# Patient Record
Sex: Male | Born: 1990 | Race: Black or African American | Hispanic: No | Smoking: Former smoker
Health system: Southern US, Community
[De-identification: ages and names within clinical notes are randomized; demographics above are authoritative.]

## PROBLEM LIST (undated history)

## (undated) DIAGNOSIS — M6282 Rhabdomyolysis: Secondary | ICD-10-CM

---

## 2014-04-10 ENCOUNTER — Emergency Department: Payer: Self-pay | Admitting: Internal Medicine

## 2016-09-04 ENCOUNTER — Encounter: Payer: Self-pay | Admitting: Emergency Medicine

## 2016-09-04 ENCOUNTER — Emergency Department
Admission: EM | Admit: 2016-09-04 | Discharge: 2016-09-04 | Disposition: A | Discharge status: Home / Self Care | Payer: Self-pay | Attending: Emergency Medicine | Admitting: Emergency Medicine

## 2016-09-04 ENCOUNTER — Emergency Department: Payer: Self-pay

## 2016-09-04 DIAGNOSIS — S93402A Sprain of unspecified ligament of left ankle, initial encounter: Secondary | ICD-10-CM | POA: Insufficient documentation

## 2016-09-04 DIAGNOSIS — Y929 Unspecified place or not applicable: Secondary | ICD-10-CM | POA: Insufficient documentation

## 2016-09-04 DIAGNOSIS — X501XXA Overexertion from prolonged static or awkward postures, initial encounter: Secondary | ICD-10-CM | POA: Insufficient documentation

## 2016-09-04 DIAGNOSIS — Y9389 Activity, other specified: Secondary | ICD-10-CM | POA: Insufficient documentation

## 2016-09-04 DIAGNOSIS — Y999 Unspecified external cause status: Secondary | ICD-10-CM | POA: Insufficient documentation

## 2016-09-04 MED ORDER — ETODOLAC 200 MG PO CAPS: 200.0000 mg | ORAL_CAPSULE | Freq: Three times a day (TID) | ORAL | 0 refills | Status: DC

## 2016-09-04 NOTE — ED Triage Notes (Signed)
Pt ambulatory to triage with boot in place to left foot; Pt reports in June sprained left ankle, healed over few months and then reinjured over weekend

## 2016-09-04 NOTE — ED Provider Notes (Signed)
[facility] Emergency Department Provider Note   ____________________________________________   First MD Initiated Contact with Patient 09/04/16 0530     (approximate)  I have reviewed the triage vital signs and the nursing notes.   HISTORY  Chief Complaint Ankle Pain    HPI Erik Kent is a 25 y.o. male who comes into the hospital today reporting that his foot is destroyed. The patient reports that he is unsure if he has a severe sprain or fracture. The patient hurt his left ankle initially in June. The patient is a vascular player. He reports he didn't get it checked out post is taken care of at home. The patient reinjured his ankle over the weekend. He was working out and reports that he slightly twisted it. He has not been taking any medications and has not iced it recently but reports it is been hurting. The patient rates his pain a 10 out of 10 in intensity currently. He has been unable to walk on it without a boot. The patient has a boot at home which he has been using. The patient decided to come [facility] [employer] [facility].    History reviewed. No pertinent past medical history.  There are no active problems to display for this patient.   History reviewed. No pertinent surgical history.  Prior to Admission medications   Not on File    Allergies Patient has no known allergies.  No family history on file.  Social History Social History  Substance Use Topics  . Smoking status: Never Smoker  . Smokeless tobacco: Never Used  . Alcohol use No    Review of Systems Constitutional: No fever/chills Eyes: No visual changes. ENT: No sore throat. Cardiovascular: Denies chest pain. Respiratory: Denies shortness of breath. Gastrointestinal: No abdominal pain.  No nausea, no vomiting.  No diarrhea.  No constipation. Genitourinary: Negative for dysuria. Musculoskeletal:Left ankle pain Skin: Negative for  rash. Neurological: Negative for headaches, focal weakness or numbness.  10-point ROS otherwise negative.  ____________________________________________   PHYSICAL EXAM:  VITAL SIGNS: ED Triage Vitals  Enc Vitals Group     BP 09/04/16 0505 133/73     Pulse Rate 09/04/16 0505 87     Resp 09/04/16 0505 18     Temp 09/04/16 0505 98 F (36.7 C)     Temp Source 09/04/16 0505 Oral     SpO2 09/04/16 0505 99 %     Weight 09/04/16 0503 165 lb (74.8 kg)     Height 09/04/16 0503 5\' 10"  (1.778 m)     Head Circumference --      Peak Flow --      Pain Score 09/04/16 0503 10     Pain Loc --      Pain Edu? --      Excl. in GC? --     Constitutional: Alert and oriented. The patient is sitting on the bed playing on his phone, he is well appearing and in mild distress. Eyes: Conjunctivae are normal. PERRL. EOMI. Head: Atraumatic. Nose: No congestion/rhinnorhea. Mouth/Throat: Mucous membranes are moist.  Oropharynx non-erythematous. Cardiovascular: Normal rate, regular rhythm. Grossly normal heart sounds.  Good peripheral circulation. Respiratory: Normal respiratory effort.  No retractions. Lungs CTAB. Gastrointestinal: Soft and nontender. No distention. Positive bowel sounds Musculoskeletal: No lower extremity tenderness nor edema.   Neurologic:  Normal speech and language.  Skin:  Skin is warm, dry and intact. Marland Kitchen. Psychiatric: Mood and affect are  normal.   ____________________________________________   LABS (all labs ordered are listed, but only abnormal results are displayed)  Labs Reviewed - No data to display ____________________________________________  EKG  none ____________________________________________  RADIOLOGY  Left Ankle Xray ____________________________________________   PROCEDURES  Procedure(s) performed: None  Procedures  Critical Care performed: No  ____________________________________________   INITIAL IMPRESSION / ASSESSMENT AND PLAN / ED  COURSE  Pertinent labs & imaging results that were available during my care of the patient were reviewed by me and considered in my medical decision making (see chart for details).  This is a [id number] rolled male who comes into the hospital today with some left-sided ankle pain. The patient reports that he really injured it recently. I will give the patient a shot of Toradol and we'll put him in an ankle stirrup. I will have the patient follow-up with orthopedic surgery for further evaluation of this ankle pain. The patient did receive an x-ray that was unremarkable. The patient has some swelling so I will also encourage him to rest ice and elevate his left ankle. He'll be discharged home.   Clinical Course as of Sep 04 602  Tue Sep 04, 2016  0541 Mild circumferential soft tissue swelling. No acute fracture or dislocation of the left ankle.   DG Ankle Complete Left [AW]    Clinical Course User Index [AW] Rebecka ApleyAllison P Arlesia Kiel, MD     ____________________________________________   FINAL CLINICAL IMPRESSION(S) / ED DIAGNOSES  Final diagnoses:  Sprain of left ankle, unspecified ligament, initial encounter      NEW MEDICATIONS STARTED DURING THIS VISIT:  New Prescriptions   No medications on file     Note:  This document was prepared using Dragon voice recognition software and may include unintentional dictation errors.    Rebecka ApleyAllison P Lenetta Piche, MD 09/04/16 [phone]

## 2016-09-13 ENCOUNTER — Encounter: Payer: Self-pay | Admitting: Podiatry

## 2016-09-18 NOTE — Progress Notes (Signed)
Patient cancelled appointment.

## 2017-10-20 ENCOUNTER — Other Ambulatory Visit: Payer: Self-pay

## 2017-10-20 ENCOUNTER — Emergency Department: Payer: Self-pay

## 2017-10-20 ENCOUNTER — Encounter: Payer: Self-pay | Admitting: Emergency Medicine

## 2017-10-20 ENCOUNTER — Emergency Department
Admission: EM | Admit: 2017-10-20 | Discharge: 2017-10-20 | Disposition: A | Discharge status: Home / Self Care | Payer: Self-pay | Attending: Student in an Organized Health Care Education/Training Program | Admitting: Student in an Organized Health Care Education/Training Program

## 2017-10-20 DIAGNOSIS — F17200 Nicotine dependence, unspecified, uncomplicated: Secondary | ICD-10-CM | POA: Insufficient documentation

## 2017-10-20 DIAGNOSIS — R079 Chest pain, unspecified: Secondary | ICD-10-CM | POA: Insufficient documentation

## 2017-10-20 DIAGNOSIS — Z79899 Other long term (current) drug therapy: Secondary | ICD-10-CM | POA: Insufficient documentation

## 2017-10-20 LAB — CBC
HEMATOCRIT: 45.6 % (ref 40.0–52.0)
Hemoglobin: 14.3 g/dL (ref 13.0–18.0)
MCH: 24 pg — AB (ref 26.0–34.0)
MCHC: 31.4 g/dL — AB (ref 32.0–36.0)
MCV: 76.4 fL — AB (ref 80.0–100.0)
PLATELETS: 231 10*3/uL (ref 150–440)
RBC: 5.97 MIL/uL — ABNORMAL HIGH (ref 4.40–5.90)
RDW: 13.7 % (ref 11.5–14.5)
WBC: 8.2 10*3/uL (ref 3.8–10.6)

## 2017-10-20 LAB — TROPONIN I: Troponin I: <0.03 ng/mL (ref <0.03)

## 2017-10-20 LAB — BASIC METABOLIC PANEL
Anion gap: 10 (ref 5–15)
BUN: 16 mg/dL (ref 6–20)
CHLORIDE: 99 mmol/L — AB (ref 101–111)
CO2: 26 mmol/L (ref 22–32)
CREATININE: 1.19 mg/dL (ref 0.61–1.24)
Calcium: 9.6 mg/dL (ref 8.9–10.3)
GFR calc Af Amer: >60 mL/min (ref >60)
GFR calc non Af Amer: >60 mL/min (ref >60)
GLUCOSE: 119 mg/dL — AB (ref 65–99)
POTASSIUM: 3.4 mmol/L — AB (ref 3.5–5.1)
SODIUM: 135 mmol/L (ref 135–145)

## 2017-10-20 LAB — CK: Total CK: 535 U/L — ABNORMAL HIGH (ref 49–397)

## 2017-10-20 MED ORDER — SODIUM CHLORIDE 0.9 % IV BOLUS (SEPSIS)
1000.0000 mL | Freq: Once | INTRAVENOUS | Status: AC
  Administered 2017-10-20: 1000 mL via INTRAVENOUS

## 2017-10-20 MED ORDER — LORAZEPAM 0.5 MG PO TABS
0.5000 mg | ORAL_TABLET | Freq: Once | ORAL | Status: AC
  Administered 2017-10-20: 0.5 mg via ORAL
  Filled 2017-10-20: qty 1

## 2017-10-20 NOTE — ED Provider Notes (Signed)
[facility] Emergency Department Provider Note    First MD Initiated Contact with Patient 10/20/17 2112     (approximate)  I have reviewed the triage vital signs and the nursing notes.   HISTORY  Chief Complaint Chest Pain    HPI Erik Kent is a 26 y.o. male is a previously healthy 60-year-old male with no previous hospitalizations presents to the ER with "clavicle pain" on the left side of his chest.  States that it is hard to describe the discomfort but does feel some pressure.  Has not noticed any change with movement.  Denies any trauma.  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  Denies any fevers.  States that he has not had any associated nausea or vomiting.  Denies any neck pain.  States the pain is mild.  Has noted intermittent flutters.  States he was having worsening discomfort today so he went for a run and then went to lift weights.  He states he tried to taken out to make symptoms better but they did not improved.  Denies any personal history of heart disease.  No family history of heart disease.  Family history of sudden cardiac death.  History reviewed. No pertinent past medical history. No family history on file. History reviewed. No pertinent surgical history. There are no active problems to display for this patient.     Prior to Admission medications   Medication Sig Start Date End Date Taking? Authorizing Provider  etodolac (LODINE) 200 MG capsule Take 1 capsule (200 mg total) by mouth every 8 (eight) hours. 09/04/16   Erik Apley, MD    Allergies Patient has no known allergies.    Social History Social History   Tobacco Use  . Smoking status: Current Some Day Smoker  . Smokeless tobacco: Never Used  Substance Use Topics  . Alcohol use: No    Comment: occasionally  . Drug use:  Yes    Types: Marijuana    Review of Systems Patient denies headaches, rhinorrhea, blurry vision, numbness, shortness of breath, chest pain, edema, cough, abdominal pain, nausea, vomiting, diarrhea, dysuria, fevers, rashes or hallucinations unless otherwise stated above in HPI. ____________________________________________   PHYSICAL EXAM:  VITAL SIGNS: Vitals:   10/20/17 2045  BP: (!) 149/64  Pulse: 75  Resp: 18  Temp: 98.4 F (36.9 C)  SpO2: 99%    Constitutional: Alert and oriented. Well appearing and in no acute distress. Eyes: Conjunctivae are normal.  Head: Atraumatic. Nose: No congestion/rhinnorhea. Mouth/Throat: Mucous membranes are moist.   Neck: Painless ROM.  Cardiovascular:   Good peripheral circulation.  No murmurs gallops or rubs noted Respiratory: Normal respiratory effort.  No retractions.  Gastrointestinal: Soft and nontender.  Musculoskeletal: No lower extremity tenderness .  No joint effusions. Neurologic:  Normal speech and language. No gross focal neurologic deficits are appreciated.  Skin:  Skin is warm, dry and intact. No rash noted. Psychiatric: Mood and affect are normal. Speech and behavior are normal.  ____________________________________________   LABS (all labs ordered are listed, but only abnormal results are displayed)  Results for orders placed or performed during the Kent encounter of 10/20/17 (from the past 24 hour(s))  Basic metabolic panel     Status: Abnormal   Collection Time: 10/20/17  8:40 PM  Result Value Ref Range   Sodium 135 135 -  145 mmol/L   Potassium 3.4 (L) 3.5 - 5.1 mmol/L   Chloride 99 (L) 101 - 111 mmol/L   CO2 26 22 - 32 mmol/L   Glucose, Bld 119 (H) 65 - 99 mg/dL   BUN 16 6 - 20 mg/dL   Creatinine, Ser 1.611.19 0.61 - 1.24 mg/dL   Calcium 9.6 8.9 - 09.610.3 mg/dL   GFR calc non Af Amer >60 >60 mL/min   GFR calc Af Amer >60 >60 mL/min   Anion gap 10 5 - 15  CBC     Status: Abnormal   Collection Time: 10/20/17   8:40 PM  Result Value Ref Range   WBC 8.2 3.8 - 10.6 K/uL   RBC 5.97 (H) 4.40 - 5.90 MIL/uL   Hemoglobin 14.3 13.0 - 18.0 g/dL   HCT 04.545.6 40.940.0 - 81.152.0 %   MCV 76.4 (L) 80.0 - 100.0 fL   MCH 24.0 (L) 26.0 - 34.0 pg   MCHC 31.4 (L) 32.0 - 36.0 g/dL   RDW 91.413.7 78.211.5 - 95.614.5 %   Platelets 231 150 - 440 K/uL  Troponin I     Status: None   Collection Time: 10/20/17  8:40 PM  Result Value Ref Range   Troponin I <0.03 <0.03 ng/mL   ____________________________________________  EKG My review and personal interpretation at Time: 20:42   Indication: chest pain  Rate: 90  Rhythm: sinus Axis: normal Other: normal intervals, no wpw, brugada or prolonget QT ____________________________________________  RADIOLOGY  I personally reviewed all radiographic images ordered to evaluate for the above acute complaints and reviewed radiology reports and findings.  These findings were personally discussed with the patient.  Please see medical record for radiology report.  ____________________________________________   PROCEDURES  Procedure(s) performed:  Procedures    Critical Care performed: no ____________________________________________   INITIAL IMPRESSION / ASSESSMENT AND PLAN / ED COURSE  Pertinent labs & imaging results that were available during my care of the patient were reviewed by me and considered in my medical decision making (see chart for details).  DDX: ACS, pericarditis, esophagitis, boerhaaves, pe, dissection, pna, bronchitis, costochondritis   Erik Kent is a 26 y.o. who presents to the ED with symptoms as described above.  Patient well-appearing and in no acute distress.  No evidence of trauma.  No evidence of respiratory distress.  EKG shows no evidence of preexcitation syndrome or ischemia.  Patient with heart score of 0.  He is low risk by Wells criteria and is PERC negative.  His abdominal exam is soft and benign.  Chest x-ray shows no evidence of pneumothorax  fracture or consolidation.  Does not have any murmurs gallops or rubs or exertional syncope to suggest hypertrophic occlusive cardiomyopathy.  Patient's CK was mildly elevated I do suspect some component of dehydration.  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  Patient was given IV fluids for component of dehydration as the patient was having dark colored urine with elevated CK.  He is tolerating oral hydration.  Did discuss signs and symptoms for which he should return to the [facility].  I did give patient referral to cardiology and discussed reasons patient to follow-up with cardiology and PCP.  Have discussed with the patient and available family all diagnostics and treatments performed thus far and all questions were answered to the best of my ability. The patient demonstrates understanding and agreement with plan.  ____________________________________________   FINAL CLINICAL IMPRESSION(S) / ED DIAGNOSES  Final diagnoses:  Chest pain, unspecified type      NEW MEDICATIONS STARTED DURING THIS VISIT:  This SmartLink is deprecated. Use AVSMEDLIST instead to display the medication list for a patient.   Note:  This document was prepared using Dragon voice recognition software and may include unintentional dictation errors.     Willy Eddyobinson, Binyamin Nelis, MD 10/20/17 2241

## 2017-10-20 NOTE — Discharge Instructions (Signed)
Sure to drink plenty of fluids.  Return immediately for any worsening shortness of breath, worsening chest pain or for any other questions or concerns.

## 2017-10-20 NOTE — ED Notes (Signed)
Pt to BR at this time. EKG delayed due to pt in BR.

## 2017-10-20 NOTE — ED Triage Notes (Signed)
Pt c/o chest discomfort, in no acute distress at this time, ambulatory to stat desk.

## 2017-10-20 NOTE — ED Notes (Signed)
Pt to the er for pain to the left side chest near the clavicle. Pt worked out on Thursday or Friday since last workout. Pt denies any injury but is unsure. Pain is not worse with movement. Pt has not noticed any swelling or SOB. Pt has some heart flutters but that has been going on for a while. Pt reports the flutters are what brought him in. Pt denies any dizziness or shaking.

## 2017-10-20 NOTE — ED Notes (Signed)
Patient transported to X-ray 

## 2017-11-10 ENCOUNTER — Other Ambulatory Visit: Payer: Self-pay

## 2017-11-10 ENCOUNTER — Encounter: Payer: Self-pay | Admitting: Emergency Medicine

## 2017-11-10 ENCOUNTER — Emergency Department
Admission: EM | Admit: 2017-11-10 | Discharge: 2017-11-10 | Disposition: A | Discharge status: Home / Self Care | Payer: Self-pay | Attending: Emergency Medicine | Admitting: Emergency Medicine

## 2017-11-10 ENCOUNTER — Emergency Department: Payer: Self-pay

## 2017-11-10 DIAGNOSIS — M6282 Rhabdomyolysis: Secondary | ICD-10-CM

## 2017-11-10 DIAGNOSIS — R079 Chest pain, unspecified: Secondary | ICD-10-CM

## 2017-11-10 DIAGNOSIS — R002 Palpitations: Secondary | ICD-10-CM

## 2017-11-10 DIAGNOSIS — F17200 Nicotine dependence, unspecified, uncomplicated: Secondary | ICD-10-CM | POA: Insufficient documentation

## 2017-11-10 DIAGNOSIS — R001 Bradycardia, unspecified: Secondary | ICD-10-CM

## 2017-11-10 LAB — BASIC METABOLIC PANEL
Anion gap: 8 (ref 5–15)
BUN: 12 mg/dL (ref 6–20)
CO2: 27 mmol/L (ref 22–32)
Calcium: 9.7 mg/dL (ref 8.9–10.3)
Chloride: 102 mmol/L (ref 101–111)
Creatinine, Ser: 1.1 mg/dL (ref 0.61–1.24)
GFR calc non Af Amer: >60 mL/min (ref >60)
Glucose, Bld: 96 mg/dL (ref 65–99)
POTASSIUM: 4.2 mmol/L (ref 3.5–5.1)
SODIUM: 137 mmol/L (ref 135–145)

## 2017-11-10 LAB — TROPONIN I

## 2017-11-10 LAB — CBC
HCT: 44.7 % (ref 40.0–52.0)
HEMOGLOBIN: 14.2 g/dL (ref 13.0–18.0)
MCH: 24.5 pg — ABNORMAL LOW (ref 26.0–34.0)
MCHC: 31.7 g/dL — AB (ref 32.0–36.0)
MCV: 77.5 fL — ABNORMAL LOW (ref 80.0–100.0)
Platelets: 236 10*3/uL (ref 150–440)
RBC: 5.77 MIL/uL (ref 4.40–5.90)
RDW: 13.2 % (ref 11.5–14.5)
WBC: 4.5 10*3/uL (ref 3.8–10.6)

## 2017-11-10 LAB — TSH: TSH: 1.468 u[IU]/mL (ref 0.350–4.500)

## 2017-11-10 LAB — CK: CK TOTAL: 615 U/L — AB (ref 49–397)

## 2017-11-10 MED ORDER — ASPIRIN 81 MG PO CHEW
324.0000 mg | CHEWABLE_TABLET | Freq: Once | ORAL | Status: AC
  Administered 2017-11-10: 324 mg via ORAL
  Filled 2017-11-10: qty 4

## 2017-11-10 NOTE — Discharge Instructions (Addendum)
Please drink plenty of fluid to stay well hydrated and prevent rhadbomyolysis, as rhabdomyolysis can cause permanent damage, including to your kidneys.  Please avoid NSAID medications, including Ibuprofen, Alleve, Motrin or Advil.  Please make a follow-up appointment with your primary care physician to have your CK and your kidney function rechecked, and to discuss the symptoms that have been bothering you.  Return to the emergency department for severe pain, changes in the color of your urine, fever, chest pain, or any other symptoms concerning to you.

## 2017-11-10 NOTE — ED Provider Notes (Signed)
[facility] Emergency Department Provider Note  ____________________________________________  Time seen: Approximately 2:27 PM  I have reviewed the triage vital signs and the nursing notes.   HISTORY  Chief Complaint Chest Pain    HPI Erik Kent is a 27 y.o. male, otherwise healthy, presenting with chest pain.  The patient reports that for several weeks he has had a central chest discomfort over the sternum that is worse, if he contracts his shoulders and pectoral muscles forward.  He denies any association with food, exertion.  He also occasionally reports "feeling his heartbeat and being able to feel his heartbeat in his neck" without any associated lightheadedness or syncope, visual changes, or shortness of breath.  The symptoms make him feel anxious.  He also has nonspecific symptoms including a cut over his right clavicle, now resolved, and feeling like his eyes "get heavy."  the patient was seen here 12/23, with a reassuring workup with the exception of mild rhabdomyolysis.  After discharge, he did not make the recommended follow-up appointments with the primary care physician or cardiologist.  At this time, the patient is symptom-free.  Sh: + occasional tobacco and marijuana, none since 12/23, denies cocaine; professional football player in [place]  FH: no early CAD, no sudden cardiac death    History reviewed. No pertinent past medical history.  There are no active problems to display for this patient.   History reviewed. No pertinent surgical history.  Current Outpatient Rx  . Order #: [id number] Class: Print    Allergies Patient has no known allergies.  No family history on file.  Social History Social History   Tobacco Use  . Smoking status: Current Some Day Smoker  . Smokeless tobacco: Never Used  Substance Use Topics  . Alcohol use: No    Comment: occasionally  . Drug use: Yes    Types: Marijuana    Review of  Systems Constitutional: No fever/chills.  No lightheadedness or syncope.  No myalgias.  No trauma.  Positive significant exercise and strain in the chest from his football workouts. Eyes: No visual changes.  Positive eye heaviness sensation without visual changes including blurred vision or double vision. ENT: No sore throat. No congestion or rhinorrhea. Cardiovascular: Positive central chest pain.  Positive palpitations. Respiratory: Denies shortness of breath.  No cough. Gastrointestinal: No abdominal pain.  No nausea, no vomiting.  No diarrhea.  No constipation. Genitourinary: Negative for dysuria. Musculoskeletal: Negative for back pain. Skin: Negative for rash. Neurological: Negative for headaches. No focal numbness, tingling or weakness.     ____________________________________________   PHYSICAL EXAM:  VITAL SIGNS: ED Triage Vitals  Enc Vitals Group     BP 11/10/17 1311 123/76     Pulse Rate 11/10/17 1311 60     Resp 11/10/17 1311 16     Temp 11/10/17 1311 98.3 F (36.8 C)     Temp Source 11/10/17 1311 Oral     SpO2 11/10/17 1311 99 %     Weight 11/10/17 1317 165 lb (74.8 kg)     Height 11/10/17 1317 5\' 10"  (1.778 m)     Head Circumference --      Peak Flow --      Pain Score 11/10/17 1310 4     Pain Loc --      Pain Edu? --      Excl. in GC? --     Constitutional: Alert and oriented. Well appearing and in no acute distress. Answers questions appropriately.  The patient  is able to move about the room without any discomfort. Eyes: Conjunctivae are normal.  EOMI. No scleral icterus. Head: Atraumatic. Nose: No congestion/rhinnorhea. Mouth/Throat: Mucous membranes are moist.  Neck: No stridor.  Supple.  No JVD.  No meningismus. Cardiovascular: Normal rate, regular rhythm. No murmurs, rubs or gallops.  The patient has a normal skin exam over his chest and I am not able to reproduce his pain with palpation; however, when he flexes his pectoral muscles or crunches  forward, he reports being able to reproduce his pain. Respiratory: Normal respiratory effort.  No accessory muscle use or retractions. Lungs CTAB.  No wheezes, rales or ronchi. Gastrointestinal: Soft, nontender and nondistended.  No guarding or rebound.  No peritoneal signs. Musculoskeletal: No LE edema. No ttp in the calves or palpable cords.  Negative Homan's sign. Neurologic:  A&Ox3.  Speech is clear.  Face and smile are symmetric.  EOMI.  Moves all extremities well. Skin:  Skin is warm, dry and intact. No rash noted. Psychiatric: Mood and affect are normal. Speech and behavior are normal.  Normal judgement  ____________________________________________   LABS (all labs ordered are listed, but only abnormal results are displayed)  Labs Reviewed  CBC - Abnormal; Notable for the following components:      Result Value   MCV 77.5 (*)    MCH 24.5 (*)    MCHC 31.7 (*)    All other components within normal limits  CK - Abnormal; Notable for the following components:   Total CK 615 (*)    All other components within normal limits  BASIC METABOLIC PANEL  TROPONIN I  TSH  CBG MONITORING, ED   ____________________________________________  EKG  ED ECG REPORT I, Rockne Menghini, the attending physician, personally viewed and interpreted this ECG.   Date: 11/10/2017  EKG Time: 1310  Rate: 57  Rhythm: sinus bradycardia  Axis: normal  Intervals:none  ST&T Change: Nonspecific T wave inversion in V1.  Early repolarization without STEMI.  No prolonged QTC, Brugada syndrome or evidence of hypertrophy.  This EKG has a poor baseline tracing and will be repeated.   Repeat EKG : ED ECG REPORT I, Rockne Menghini, the attending physician, personally viewed and interpreted this ECG.   Date: 11/10/2017  EKG Time: 1401  Rate: 52  Rhythm: sinus bradycardia  Axis: normal  Intervals:none  ST&T Change: No STEMI  ED ECG REPORT I, Rockne Menghini, the attending physician,  personally viewed and interpreted this ECG.   Date: 11/10/2017  EKG Time: 1441  Rate: 54  Rhythm: sinus bradycardia  Axis: normal  Intervals:none  ST&T Change: No STEMI   ____________________________________________  RADIOLOGY  Dg Chest 2 View  Result Date: 11/10/2017 CLINICAL DATA:  Chest tightness for 2 weeks. EXAM: CHEST  2 VIEW COMPARISON:  Two-view chest x-ray 10/20/2017 FINDINGS: The heart size and mediastinal contours are within normal limits. Both lungs are clear. The visualized skeletal structures are unremarkable. IMPRESSION: Negative two view chest x-ray Electronically Signed   By: Marin Roberts M.D.   On: 11/10/2017 14:22    ____________________________________________   PROCEDURES  Procedure(s) performed: None  Procedures  Critical Care performed: No ____________________________________________   INITIAL IMPRESSION / ASSESSMENT AND PLAN / ED COURSE  Pertinent labs & imaging results that were available during my care of the patient were reviewed by me and considered in my medical decision making (see chart for details).  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  Overall, the patient is  hemodynamically stable and has sinus bradycardia without any concerning evidence of ischemia, arrhythmia, including prolonged QTC, hypertrophy or Brugada syndrome.  The patient has no history of cocaine use, or concerning family history, nor any red flags such as recurrent syncope.  Today, the patient's clinical examination is reassuring without any cardiopulmonary abnormality or evidence of DVT.  In addition his laboratory studies including negative troponin are reassuring.  It is possible the patient's discomfort is due to musculoskeletal strain.  Today, I am awaiting his CK, I have ordered a TSH, and will evaluate his chest x-ray.  However, I anticipate he will be safe for discharge home with close PMD and  cardiology follow-up.  I did talk to him about getting an echocardiogram.  Plan reevaluation for final disposition.  ----------------------------------------- 3:49 PM on 11/10/2017 -----------------------------------------  The patient continues to be hemodynamically stable and has a reassuring workup in the emergency department.  At this time, he is resting comfortably and has to be awakened to be given his results.  His troponin is negative and chest x-ray does not show any acute cardiopulmonary process.  His electrolytes and blood counts are also reassuring.  He does have a CK of 615 today compared to 535 when he was seen here 12/23.  This is likely due to the significant musculoskeletal exertion that he undergoes for his professional football training.  Today, his creatinine is slightly lower than 12/23 at 1.10 compared to 1.19.  He does not have any evidence of red flag symptoms for rhabdomyolysis, but does have some mild rhabdomyolysis that will need to be treated with aggressive oral hydration and close monitoring of CK and creatinine as an outpatient.  I have provided him with the phone number to establish a primary care physician and recommended that he call the appointment line tomorrow between 8 and 9 AM.  He understands the importance of oral hydration.  I have recommended that he avoid NSAID medications, and significant exertional output until his CK has normalized to prevent permanent damage to his kidneys.  In addition, he will be given the number for the cardiologist on call for outpatient evaluation of his chest pain.  He understands these instructions, as well as follow-up and return precautions.    ____________________________________________  FINAL CLINICAL IMPRESSION(S) / ED DIAGNOSES  Final diagnoses:  Exertional rhabdomyolysis  Palpitations  Chest pain, unspecified type         NEW MEDICATIONS STARTED DURING THIS VISIT:  New Prescriptions   No medications on file   Rockne Menghini, MD 11/10/17 1554

## 2017-11-10 NOTE — ED Triage Notes (Signed)
First Nurse Note:  Arrives with C/O chest tightness x 2-3 weeks.  States was seen through ED in December for similar symptoms.  Patient is AAOx3.  Skin warm and dry. No SOB/ DOE

## 2017-11-10 NOTE — ED Triage Notes (Signed)
Pt presents to ER with complaints of chest tightness for about 2 weeks reports about a month ago he had a slight case of rhabdomyolysis, reports he has been feeling tired. Pt denies any other symptoms at present.

## 2017-11-12 ENCOUNTER — Ambulatory Visit
Admission: EM | Admit: 2017-11-12 | Discharge: 2017-11-12 | Disposition: A | Discharge status: Home / Self Care | Payer: Self-pay | Attending: Family Medicine | Admitting: Family Medicine

## 2017-11-12 ENCOUNTER — Other Ambulatory Visit: Payer: Self-pay

## 2017-11-12 DIAGNOSIS — R748 Abnormal levels of other serum enzymes: Secondary | ICD-10-CM

## 2017-11-12 DIAGNOSIS — F411 Generalized anxiety disorder: Secondary | ICD-10-CM

## 2017-11-12 HISTORY — DX: Rhabdomyolysis: M62.82

## 2017-11-12 LAB — URINALYSIS, COMPLETE (UACMP) WITH MICROSCOPIC
Bacteria, UA: NONE SEEN
Bilirubin Urine: NEGATIVE
GLUCOSE, UA: NEGATIVE mg/dL
HGB URINE DIPSTICK: NEGATIVE
Ketones, ur: NEGATIVE mg/dL
LEUKOCYTES UA: NEGATIVE
Nitrite: NEGATIVE
PH: 7 (ref 5.0–8.0)
PROTEIN: NEGATIVE mg/dL
RBC / HPF: NONE SEEN RBC/hpf (ref 0–5)
SQUAMOUS EPITHELIAL / LPF: NONE SEEN
Specific Gravity, Urine: <1.005 — ABNORMAL LOW (ref 1.005–1.030)
WBC, UA: NONE SEEN WBC/hpf (ref 0–5)

## 2017-11-12 LAB — CK: Total CK: 314 U/L (ref 49–397)

## 2017-11-12 NOTE — ED Provider Notes (Signed)
[facility]   CSN: [id number] Arrival date & time: 11/12/17  1421  History   Chief Complaint Chief Complaint  Patient presents with  . Stress   HPI  27 year old male presents for evaluation of stress/anxiety.  Patient is recently been seen in the ER twice.  Initial visit was for chest pain.  He had a negative cardiac workup, however his CK was found to be elevated.  He was advised to hydrate.  He was seen back in the ER on 1/13.  He states that this was primarily for reevaluation as he did not follow-up with his primary care physician.  His CK was 615 at that time.  He has followed up with his primary care physician as of yesterday.  He was told that his CK was in the 400s.  Patient presents today for repeat evaluation.  Patient states that he has been stressed and anxious about his ongoing issues.  He states that he feels like he still dehydrated.  He states that his urine is dark.  He states that he is drinking a lot of fluids including Pedialyte.  He just does not feel like himself.  He states that he has been having tingling in his lower extremities, his eyes not feeling right, palm sweating, etc.  No reports of chest pain at this time.  He is not taking any supplements or taking caffeine at this time.  He has no other complaints or concerns at this time.  Past Medical History:  Diagnosis Date  . Rhabdomyolysis    Surgical Hx - No past surgeries.   Home Medications    Prior to Admission medications   Medication Sig Start Date End Date Taking? Authorizing Provider  etodolac (LODINE) 200 MG capsule Take 1 capsule (200 mg total) by mouth every 8 (eight) hours. 09/04/16   Rebecka ApleyWebster, Allison P, MD   Family History MGF - Heart disease  Social History Social History   Tobacco Use  . Smoking status: Current Some Day Smoker    Types: Cigarettes  . Smokeless tobacco: Never Used  Substance Use Topics  . Alcohol use: Yes    Comment: occasionally  . Drug use: Yes    Types:  Marijuana    Allergies   Patient has no known allergies.   Review of Systems Review of Systems  Respiratory: Negative.   Cardiovascular: Negative.   Neurological:       Paresthesias.  Psychiatric/Behavioral: The patient is nervous/anxious.    Physical Exam Triage Vital Signs ED Triage Vitals  Enc Vitals Group     BP 11/12/17 1457 (!) 147/86     Pulse Rate 11/12/17 1457 68     Resp 11/12/17 1457 18     Temp 11/12/17 1457 98 F (36.7 C)     Temp Source 11/12/17 1457 Oral     SpO2 11/12/17 1457 100 %     Weight 11/12/17 1458 165 lb (74.8 kg)     Height 11/12/17 1458 5\' 10"  (1.778 m)     Head Circumference --      Peak Flow --      Pain Score 11/12/17 1458 0     Pain Loc --      Pain Edu? --      Excl. in GC? --    Updated Vital Signs BP (!) 147/86 (BP Location: Left Arm)   Pulse 68   Temp 98 F (36.7 C) (Oral)   Resp 18   Ht 5\' 10"  (1.778 m)  Wt 165 lb (74.8 kg)   SpO2 100%   BMI 23.68 kg/m   Physical Exam  Constitutional: He is oriented to person, place, and time. He appears well-developed. No distress.  HENT:  Head: Normocephalic and atraumatic.  Nose: Nose normal.  Mouth/Throat: Oropharynx is clear and moist.  Eyes: Conjunctivae are normal. No scleral icterus.  Cardiovascular: Normal rate and regular rhythm.  No murmur heard. Pulmonary/Chest: Effort normal and breath sounds normal. He has no wheezes. He has no rales.  Abdominal: He exhibits no distension.  Neurological: He is alert and oriented to person, place, and time.  Skin: Skin is warm. No rash noted.  Psychiatric:  Anxious, perseverative.  Nursing note and vitals reviewed.   UC Treatments / Results  Labs (all labs ordered are listed, but only abnormal results are displayed) Labs Reviewed  URINALYSIS, COMPLETE (UACMP) WITH MICROSCOPIC - Abnormal; Notable for the following components:      Result Value   Specific Gravity, Urine <1.005 (*)    All other components within normal limits  CK      EKG  EKG Interpretation None       Radiology No results found.  Procedures Procedures (including critical care time)  Medications Ordered in UC Medications - No data to display   Initial Impression / Assessment and Plan / UC Course  I have reviewed the triage vital signs and the nursing notes.  Pertinent labs & imaging results that were available during my care of the patient were reviewed by me and considered in my medical decision making (see chart for details).     27 year old male presents ongoing anxiety.  CK was rechecked today and was within normal limits.  I advised him that he is well-appearing and his urinalysis is normal.  Advised continued hydration.  Rest and supportive care.  Follow-up with his primary care physician if his anxiety persist.  Final Clinical Impressions(s) / UC Diagnoses   Final diagnoses:  Anxiety state  Elevated creatine kinase    ED Discharge Orders    None     Controlled Substance Prescriptions [place] Controlled Substance Registry consulted? Not Applicable   Tommie Sams, DO 11/12/17 7829

## 2017-11-12 NOTE — Discharge Instructions (Signed)
Continue hydration, rest.  No need to be worried at this time.  If anxiety persists, follow up with primary for treatment.  Take care  Dr. Adriana Simasook

## 2017-11-12 NOTE — ED Triage Notes (Addendum)
Pt reports he has been stressed and anxious. Was hospitalized for Rhabdo in December. Has continued to feel weak  And went to [facility] ER on 1/13 but was not admitted. Had more blood tests done yesterday through [facility] and his levels were much better. He reports he thinks he is dehydrated although he reports he drinks a gallon of water a day.

## 2017-11-15 ENCOUNTER — Telehealth: Payer: Self-pay

## 2017-11-15 NOTE — Telephone Encounter (Signed)
Called to follow up with patient since visit here at [facility]. Patient instructed to call back with any questions or concerns. MAH  

## 2018-01-07 ENCOUNTER — Other Ambulatory Visit: Payer: Self-pay

## 2018-01-07 ENCOUNTER — Ambulatory Visit (INDEPENDENT_AMBULATORY_CARE_PROVIDER_SITE_OTHER): Payer: Self-pay

## 2018-01-07 ENCOUNTER — Ambulatory Visit
Admission: EM | Admit: 2018-01-07 | Discharge: 2018-01-07 | Disposition: A | Discharge status: Home / Self Care | Payer: Self-pay | Attending: Family Medicine | Admitting: Family Medicine

## 2018-01-07 DIAGNOSIS — M12541 Traumatic arthropathy, right hand: Secondary | ICD-10-CM

## 2018-01-07 MED ORDER — MELOXICAM 15 MG PO TABS: 15.0000 mg | ORAL_TABLET | Freq: Every day | ORAL | 0 refills | Status: DC

## 2018-01-07 NOTE — Discharge Instructions (Signed)
Avoid symptoms with weight lifting etc. by ice to the joint 20 minutes out of every 2 hours 4-5 times daily.  Use the splint for comfort.

## 2018-01-07 NOTE — ED Triage Notes (Signed)
Pt with injury to right ring finger on March 2 while playing football. Pain 6/10

## 2018-01-07 NOTE — ED Provider Notes (Signed)
[facility]    CSN: [id number] Arrival date & time: 01/07/18  1435     History   Chief Complaint Chief Complaint  Patient presents with  . Hand Injury    HPI Erik Kent is a 27 y.o. male.   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  Since that time he has had pain and swelling in the PIP joint.       Past Medical History:  Diagnosis Date  . Rhabdomyolysis     There are no active problems to display for this patient.   History reviewed. No pertinent surgical history.     Home Medications    Prior to Admission medications   Medication Sig Start Date End Date Taking? Authorizing Provider  meloxicam (MOBIC) 15 MG tablet Take 1 tablet (15 mg total) by mouth daily. 01/07/18   Lutricia Feiloemer, Damante Spragg P, PA-C    Family History History reviewed. No pertinent family history.  Social History Social History   Tobacco Use  . Smoking status: Current Some Day Smoker    Types: Cigarettes  . Smokeless tobacco: Never Used  Substance Use Topics  . Alcohol use: Yes    Comment: occasionally  . Drug use: Yes    Types: Marijuana     Allergies   Patient has no known allergies.   Review of Systems Review of Systems  Constitutional: Positive for activity change. Negative for chills, fatigue and fever.  Musculoskeletal: Positive for arthralgias and joint swelling.  All other systems reviewed and are negative.    Physical Exam Triage Vital Signs ED Triage Vitals  Enc Vitals Group     BP 01/07/18 1442 140/74     Pulse Rate 01/07/18 1442 (!) 59     Resp 01/07/18 1442 16     Temp 01/07/18 1442 98.1 F (36.7 C)     Temp Source 01/07/18 1442 Oral     SpO2 01/07/18 1442 100 %     Weight 01/07/18 1444 170 lb (77.1 kg)     Height 01/07/18 1444 5\' 10"  (1.778 m)     Head Circumference --      Peak Flow --      Pain Score 01/07/18 1444 6     Pain Loc --        Pain Edu? --      Excl. in GC? --    No data found.  Updated Vital Signs BP 140/74 (BP Location: Left Arm)   Pulse (!) 59   Temp 98.1 F (36.7 C) (Oral)   Resp 16   Ht 5\' 10"  (1.778 m)   Wt 170 lb (77.1 kg)   SpO2 100%   BMI 24.39 kg/m   Visual Acuity Right Eye Distance:   Left Eye Distance:   Bilateral Distance:    Right Eye Near:   Left Eye Near:    Bilateral Near:     Physical Exam  Constitutional: He is oriented to person, place, and time. He appears well-developed and well-nourished. No distress.  HENT:  Head: Normocephalic.  Eyes: Pupils are equal, round, and reactive to light. Right eye exhibits no discharge. Left eye exhibits no discharge.  Neck: Normal range of motion.  Musculoskeletal: Normal range of motion. He exhibits edema, tenderness and deformity.  Examination of the right fourth finger and hand shows is a fusi form swelling of the PIP joint.  Has good  range of motion of the joint collateral ligaments are intact and strong.  He does have discomfort with the compression bilaterally as well as anterior posterior.  Sensation is intact.  Extensor and flexor tendons are both strong to stressing.  There is no apparent injury to the volar plate.  Neurological: He is alert and oriented to person, place, and time.  Skin: Skin is warm and dry. He is not diaphoretic.  Psychiatric: He has a normal mood and affect. His behavior is normal. Judgment and thought content normal.  Nursing note and vitals reviewed.    UC Treatments / Results  Labs (all labs ordered are listed, but only abnormal results are displayed) Labs Reviewed - No data to display  EKG  EKG Interpretation None       Radiology Dg Finger Ring Right  Result Date: 01/07/2018 CLINICAL DATA:  Football injury with fourth digit pain, initial encounter EXAM: RIGHT RING FINGER 2+V COMPARISON:  None. FINDINGS: Soft tissue swelling is noted. No definitive fracture or dislocation is seen. IMPRESSION:  Soft tissue swelling without acute bony abnormality. Electronically Signed   By: Alcide Clever M.D.   On: 01/07/2018 15:27    Procedures Procedures (including critical care time)  Medications Ordered in UC Medications - No data to display   Initial Impression / Assessment and Plan / UC Course  I have reviewed the triage vital signs and the nursing notes.  Pertinent labs & imaging results that were available during my care of the patient were reviewed by me and considered in my medical decision making (see chart for details).     Plan: 1. Test/x-ray results and diagnosis reviewed with patient 2. rx as per orders; risks, benefits, potential side effects reviewed with patient 3. Recommend supportive treatment with symptom avoidance.  I provided him with an extension splint .  He should take this off at nighttime.  He continues to have problems he should follow-up with an orthopedic surgeon.  20 minutes out of every 2 hours 4-5 times daily for comfort. 4. F/u prn if symptoms worsen or don't improve   Final Clinical Impressions(s) / UC Diagnoses   Final diagnoses:  Traumatic arthritis of finger of right hand    ED Discharge Orders        Ordered    meloxicam (MOBIC) 15 MG tablet  Daily     01/07/18 1607       Controlled Substance Prescriptions [place] Controlled Substance Registry consulted? Not Applicable   Lutricia Feil, PA-C 01/07/18 1623

## 2018-03-01 ENCOUNTER — Encounter: Payer: Self-pay | Admitting: Emergency Medicine

## 2018-03-01 ENCOUNTER — Other Ambulatory Visit: Payer: Self-pay

## 2018-03-01 ENCOUNTER — Ambulatory Visit
Admission: EM | Admit: 2018-03-01 | Discharge: 2018-03-01 | Disposition: A | Discharge status: Home / Self Care | Payer: Self-pay | Attending: Family Medicine | Admitting: Family Medicine

## 2018-03-01 DIAGNOSIS — M79604 Pain in right leg: Secondary | ICD-10-CM

## 2018-03-01 MED ORDER — MELOXICAM 15 MG PO TABS: 15.0000 mg | ORAL_TABLET | Freq: Every day | ORAL | 0 refills | Status: DC | PRN

## 2018-03-01 NOTE — ED Triage Notes (Signed)
Patient c/o right lower leg pain after working out on Monday.

## 2018-03-01 NOTE — ED Notes (Signed)
Bed: MUC07 Expected date:  Expected time:  Means of arrival:  Comments: HOLD

## 2018-03-01 NOTE — ED Provider Notes (Signed)
[facility] ____________________________________________  Time seen: Approximately 4:19 PM  I have reviewed the triage vital signs and the nursing notes.   HISTORY  Chief Complaint Leg Pain (right lower leg)   HPI Erik Kent is a 27 y.o. male presenting for evaluation of right posterior leg pain that is been present since this past Wednesday that came on while exercising.  Patient reports that he was actively doing calf raises with weights when he started to have pain to right calf.  Reports pain to right calf is primarily with walking or direct palpation, minimal pain at rest.  Denies pain radiation, paresthesias, extremity swelling, decreased activity or history of similar in the past.  Again reports onset was during weight activity.  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  Has taken occasional over-the-counter ibuprofen without much change.  Denies other aggravating or alleviating factors.  Reports otherwise feels well.Denies recent sickness. Denies recent antibiotic use.    Past Medical History:  Diagnosis Date  . Rhabdomyolysis     There are no active problems to display for this patient.   History reviewed. No pertinent surgical history.   No current facility-administered medications for this encounter.   Current Outpatient Medications:  .  meloxicam (MOBIC) 15 MG tablet, Take 1 tablet (15 mg total) by mouth daily as needed., Disp: 10 tablet, Rfl: 0  Allergies Patient has no known allergies.  History reviewed. No pertinent family history.  Social History Social History   Tobacco Use  . Smoking status: Current Some Day Smoker    Types: Cigarettes  . Smokeless tobacco: Never Used  Substance Use Topics  . Alcohol use: Yes    Comment: occasionally  . Drug use: Yes    Types: Marijuana    Review of Systems Constitutional: No fever/chills Cardiovascular: Denies chest pain. Respiratory: Denies shortness of breath. Gastrointestinal: No  abdominal pain.   Musculoskeletal: Negative for back pain.as above.  Skin: Negative for rash.  ____________________________________________   PHYSICAL EXAM:  VITAL SIGNS: ED Triage Vitals  Enc Vitals Group     BP 03/01/18 1456 120/77     Pulse Rate 03/01/18 1456 73     Resp 03/01/18 1456 16     Temp 03/01/18 1456 98.1 F (36.7 C)     Temp Source 03/01/18 1456 Oral     SpO2 03/01/18 1456 99 %     Weight 03/01/18 1454 170 lb (77.1 kg)     Height 03/01/18 1454  (1.778 m)     Head Circumference --      Peak Flow --      Pain Score 03/01/18 1454 8     Pain Loc --      Pain Edu? --      Excl. in GC? --     Constitutional: Alert and oriented. Well appearing and in no acute distress. Cardiovascular: Normal rate, regular rhythm. Grossly normal heart sounds.  Good peripheral circulation. Respiratory: Normal respiratory effort without tachypnea nor retractions. Breath sounds are clear and equal bilaterally. No wheezes, rales, rhonchi. Musculoskeletal:  No midline cervical, thoracic or lumbar tenderness to palpation. Bilateral pedal pulses equal and easily palpated. Except: Right posterior calf mild diffuse gastrocnemius muscle tenderness, with moderate point tenderness mid medial gastrocnemius muscle, no edema, no erythema, pain fully reproducible by direct palpation, negative bilateral Thompson squeeze test, mild pain with plantarflexion and dorsiflexion resisted, patient unable to calf raise standing with right due to pain, right lower externally otherwise nontender.  No  edema bilaterally. Neurologic:  Normal speech and language. Speech is normal. No gait instability.  Skin:  Skin is warm, dry and intact. No rash noted. Psychiatric: Mood and affect are normal. Speech and behavior are normal. Patient exhibits appropriate insight and judgment   ___________________________________________   LABS (all labs ordered are listed, but only abnormal results are displayed)  Labs Reviewed  - No data to display ____________________________________________  PROCEDURES Procedures   INITIAL IMPRESSION / ASSESSMENT AND PLAN / ED COURSE  Pertinent labs & imaging results that were available during my care of the patient were reviewed by me and considered in my medical decision making (see chart for details).  Well-appearing patient.  No acute distress.  Right calf painThat occurred after cath raises with weights.  Suspect right gastrocnemius muscle tear.  Discussed evaluation of x-ray at this time, as no direct trauma will defer.  Recommend rest, ice, supportive care, crutches use, oral Mobic and follow-up this week as pain continues.  Information for orthopedic given.Discussed indication, risks and benefits of medications with patient.  Discussed follow up with Primary care physician this week. Discussed follow up and return parameters including no resolution or any worsening concerns. Patient verbalized understanding and agreed to plan.   ____________________________________________   FINAL CLINICAL IMPRESSION(S) / ED DIAGNOSES  Final diagnoses:  Right leg pain     ED Discharge Orders        Ordered    meloxicam (MOBIC) 15 MG tablet  Daily PRN     03/01/18 1606       Note: This dictation was prepared with Dragon dictation along with smaller phrase technology. Any transcriptional errors that result from this process are unintentional.         Renford Dills, NP 03/01/18 1623

## 2018-03-01 NOTE — Discharge Instructions (Signed)
Take medication as prescribed. Rest. Drink plenty of fluids.  Use crutches.  Gradually increase activity only as tolerated.  As discussed, follow-up with orthopedics this week.  See above to call to schedule on Monday.  Follow up with your primary care physician this week as needed. Return to Urgent care for new or worsening concerns.

## 2018-03-11 ENCOUNTER — Ambulatory Visit
Admission: EM | Admit: 2018-03-11 | Discharge: 2018-03-11 | Disposition: A | Discharge status: Home / Self Care | Payer: Self-pay | Attending: Family Medicine | Admitting: Family Medicine

## 2018-03-11 ENCOUNTER — Encounter: Payer: Self-pay | Admitting: *Deleted

## 2018-03-11 DIAGNOSIS — F17210 Nicotine dependence, cigarettes, uncomplicated: Secondary | ICD-10-CM | POA: Insufficient documentation

## 2018-03-11 DIAGNOSIS — R0789 Other chest pain: Secondary | ICD-10-CM

## 2018-03-11 DIAGNOSIS — H538 Other visual disturbances: Secondary | ICD-10-CM

## 2018-03-11 LAB — CBC WITH DIFFERENTIAL/PLATELET
Basophils Absolute: 0 10*3/uL (ref 0–0.1)
Basophils Relative: 1 %
Eosinophils Absolute: 0.1 10*3/uL (ref 0–0.7)
Eosinophils Relative: 1 %
HCT: 41.7 % (ref 40.0–52.0)
Hemoglobin: 13.3 g/dL (ref 13.0–18.0)
Lymphocytes Relative: 21 %
Lymphs Abs: 1 10*3/uL (ref 1.0–3.6)
MCH: 24.4 pg — ABNORMAL LOW (ref 26.0–34.0)
MCHC: 31.9 g/dL — ABNORMAL LOW (ref 32.0–36.0)
MCV: 76.2 fL — ABNORMAL LOW (ref 80.0–100.0)
Monocytes Absolute: 0.6 10*3/uL (ref 0.2–1.0)
Monocytes Relative: 12 %
Neutro Abs: 3.1 10*3/uL (ref 1.4–6.5)
Neutrophils Relative %: 65 %
Platelets: 215 10*3/uL (ref 150–440)
RBC: 5.48 MIL/uL (ref 4.40–5.90)
RDW: 13.1 % (ref 11.5–14.5)
WBC: 4.8 10*3/uL (ref 3.8–10.6)

## 2018-03-11 LAB — URINALYSIS, COMPLETE (UACMP) WITH MICROSCOPIC
Bacteria, UA: NONE SEEN
Bilirubin Urine: NEGATIVE
Glucose, UA: NEGATIVE mg/dL
Hgb urine dipstick: NEGATIVE
Ketones, ur: NEGATIVE mg/dL
Leukocytes, UA: NEGATIVE
Nitrite: NEGATIVE
Protein, ur: NEGATIVE mg/dL
RBC / HPF: NONE SEEN RBC/hpf (ref 0–5)
Specific Gravity, Urine: 1.015 (ref 1.005–1.030)
Squamous Epithelial / LPF: NONE SEEN (ref 0–5)
pH: 7.5 (ref 5.0–8.0)

## 2018-03-11 LAB — COMPREHENSIVE METABOLIC PANEL
ALT: 26 U/L (ref 17–63)
AST: 31 U/L (ref 15–41)
Albumin: 4.2 g/dL (ref 3.5–5.0)
Alkaline Phosphatase: 50 U/L (ref 38–126)
Anion gap: 9 (ref 5–15)
BUN: 13 mg/dL (ref 6–20)
CO2: 27 mmol/L (ref 22–32)
Calcium: 9.1 mg/dL (ref 8.9–10.3)
Chloride: 99 mmol/L — ABNORMAL LOW (ref 101–111)
Creatinine, Ser: 1.06 mg/dL (ref 0.61–1.24)
GFR calc Af Amer: >60 mL/min (ref >60)
GFR calc non Af Amer: >60 mL/min (ref >60)
Glucose, Bld: 92 mg/dL (ref 65–99)
Potassium: 4.2 mmol/L (ref 3.5–5.1)
Sodium: 135 mmol/L (ref 135–145)
Total Bilirubin: 0.7 mg/dL (ref 0.3–1.2)
Total Protein: 7.5 g/dL (ref 6.5–8.1)

## 2018-03-11 MED ORDER — CLONAZEPAM 0.5 MG PO TABS: 0.5000 mg | ORAL_TABLET | Freq: Two times a day (BID) | ORAL | 0 refills | Status: DC | PRN

## 2018-03-11 NOTE — ED Provider Notes (Signed)
[facility]    CSN: [id number] Arrival date & time: 03/11/18  1621     History   Chief Complaint Chief Complaint  Patient presents with  . Chest Pain    HPI Erik Kent is a 27 y.o. male.   HPI  27 year old male presents with several different symptoms do not seem to connect.  He is complaining of chest pain that radiates into his left arm about 1/2-hour ago.  There is no history of cardiac  in himself but he states that he does have cardiac disease in his family.  Also talks of  numbness in his feet, blurred vision, pain of his distal clavicle occasional shortness of breath.  Reviewed his medical records from previous visits to the emergency room.  In 10/20/2017 he had complaints of clavicle pain worked-up for chest pain was performed and his EKG is unchanged today from then.  Most recently he has been treated for a gastrocnemius strain.        Past Medical History:  Diagnosis Date  . Rhabdomyolysis     There are no active problems to display for this patient.   History reviewed. No pertinent surgical history.     Home Medications    Prior to Admission medications   Medication Sig Start Date End Date Taking? Authorizing Provider  clonazePAM (KLONOPIN) 0.5 MG tablet Take 1 tablet (0.5 mg total) by mouth 2 (two) times daily as needed for anxiety. 03/11/18   Lutricia Feil, PA-C    Family History History reviewed. No pertinent family history.  Social History Social History   Tobacco Use  . Smoking status: Current Some Day Smoker    Types: Cigarettes  . Smokeless tobacco: Never Used  Substance Use Topics  . Alcohol use: Yes    Comment: occasionally  . Drug use: Yes    Types: Marijuana     Allergies   Patient has no known allergies.   Review of Systems Review of Systems  Constitutional: Positive for activity change. Negative for chills, fatigue and fever.  Cardiovascular: Positive for chest pain.  All other systems reviewed and  are negative.    Physical Exam Triage Vital Signs ED Triage Vitals  Enc Vitals Group     BP 03/11/18 1624 (!) 131/101     Pulse Rate 03/11/18 1624 73     Resp 03/11/18 1624 16     Temp 03/11/18 1624 98.6 F (37 C)     Temp src --      SpO2 03/11/18 1624 99 %     Weight 03/11/18 1626 170 lb (77.1 kg)     Height 03/11/18 1626  (1.778 m)     Head Circumference --      Peak Flow --      Pain Score 03/11/18 1626 6     Pain Loc --      Pain Edu? --      Excl. in GC? --    No data found.  Updated Vital Signs BP 128/90 (BP Location: Right Arm)   Pulse 73   Temp 98.6 F (37 C)   Resp 16   Ht  (1.778 m)   Wt 170 lb (77.1 kg)   SpO2 99%   BMI 24.39 kg/m   Visual Acuity Right Eye Distance:   Left Eye Distance:   Bilateral Distance:    Right Eye Near:   Left Eye Near:    Bilateral Near:     Physical Exam  Constitutional: He is oriented to person, place, and time. He appears well-developed and well-nourished.  Non-toxic appearance. He does not appear ill. No distress.  HENT:  Head: Normocephalic.  Eyes: Pupils are equal, round, and reactive to light.  Neck: Normal range of motion.  Cardiovascular: Normal rate, regular rhythm, intact distal pulses and normal pulses.  No extrasystoles are present.  Pulses:      Carotid pulses are 2+ on the right side, and 2+ on the left side.      Radial pulses are 2+ on the right side, and 2+ on the left side.       Dorsalis pedis pulses are 2+ on the right side, and 2+ on the left side.       Posterior tibial pulses are 2+ on the right side, and 2+ on the left side.  Pulmonary/Chest: Effort normal and breath sounds normal.  Abdominal: Soft. Bowel sounds are normal. He exhibits no distension. There is no tenderness. There is no guarding.  Musculoskeletal: Normal range of motion.  Neurological: He is alert and oriented to person, place, and time.  Skin: Skin is warm and dry.  Psychiatric: He has a normal mood and affect.  His behavior is normal.  Nursing note and vitals reviewed.    UC Treatments / Results  Labs (all labs ordered are listed, but only abnormal results are displayed) Labs Reviewed  CBC WITH DIFFERENTIAL/PLATELET - Abnormal; Notable for the following components:      Result Value   MCV 76.2 (*)    MCH 24.4 (*)    MCHC 31.9 (*)    All other components within normal limits  COMPREHENSIVE METABOLIC PANEL - Abnormal; Notable for the following components:   Chloride 99 (*)    All other components within normal limits  URINALYSIS, COMPLETE (UACMP) WITH MICROSCOPIC    EKG ED ECG REPORT   Date: 03/11/2018  EKG Time: 6:09 PM  Rate:63  Rhythm: normal sinus rhythm,  normal EKG, normal sinus rhythm, unchanged from previous tracings  Axis: normal  Intervals:none  ST&T Change: none  Narrative Interpretation: This rhythm with no acute changes          Radiology No results found.  Procedures Procedures (including critical care time)  Medications Ordered in UC Medications - No data to display  Initial Impression / Assessment and Plan / UC Course  I have reviewed the triage vital signs and the nursing notes.  Pertinent labs & imaging results that were available during my care of the patient were reviewed by me and considered in my medical decision making (see chart for details).     Plan: 1. Test/x-ray results and diagnosis reviewed with patient 2. rx as per orders; risks, benefits, potential side effects reviewed with patient 3. Recommend supportive treatment with follow-up appointment at [facility] .  Long discussion patient about anxiety depression and somatization of emotional disturbance.  Provided him with short supply of clonazepam to see if this does make a difference.  He has been receiving counseling from his minister has been helpful.  Believe most of his symptoms are from depression.  Encouraged to continue the counseling with his minister and to go to [facility]  clinic for further evaluation  4. F/u prn if symptoms worsen or don't improve  Final Clinical Impressions(s) / UC Diagnoses   Final diagnoses:  Atypical chest pain   Discharge Instructions   None    ED Prescriptions    Medication Sig Dispense Auth. Provider  clonazePAM (KLONOPIN) 0.5 MG tablet Take 1 tablet (0.5 mg total) by mouth 2 (two) times daily as needed for anxiety. 20 tablet Lutricia Feil, PA-C     Controlled Substance Prescriptions [place] Controlled Substance Registry consulted? Not Applicable   Lutricia Feil, PA-C 03/11/18 2956

## 2018-03-11 NOTE — ED Triage Notes (Signed)
C/o chest pain that radiates to left arm started having this pain about half an hour ago. No cardiac medical Hx. Pt states he feels dizzy and nauseated every morning. Pt states he has been seen by multiple doctors, but no one  has given him an answer of what is causing these symptoms.

## 2019-05-28 IMAGING — CR DG CHEST 2V
1 series · 2 of 2 positions shown · non-contrast
Comparison: None.

CLINICAL DATA: Left-sided chest pain near clavicle. Heart flutters.

EXAM:
CHEST  2 VIEW

[Series 1: dg chest 2 view · 0.14mm/px · 2 of 2 slices shown]
[im 1/2]
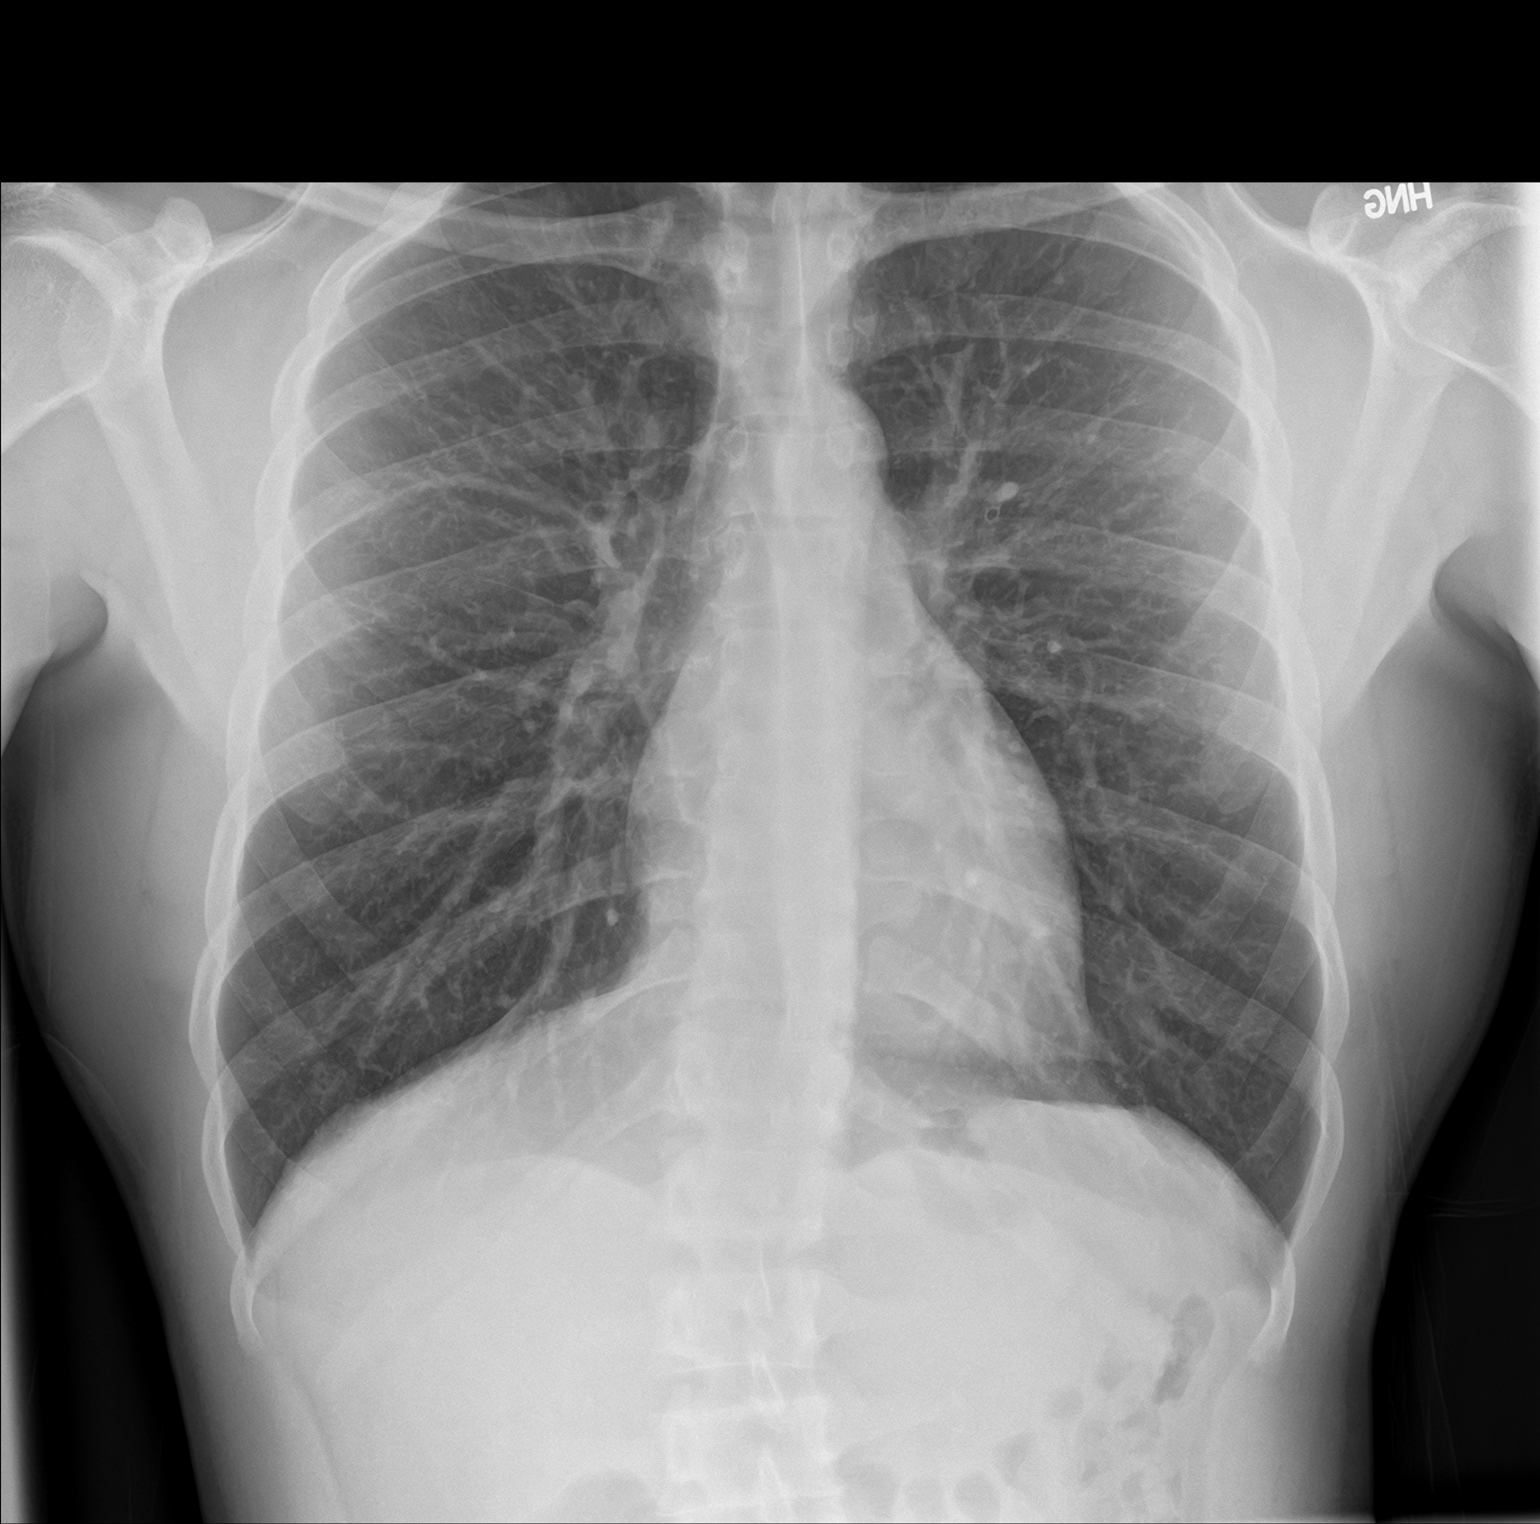
[im 2/2]
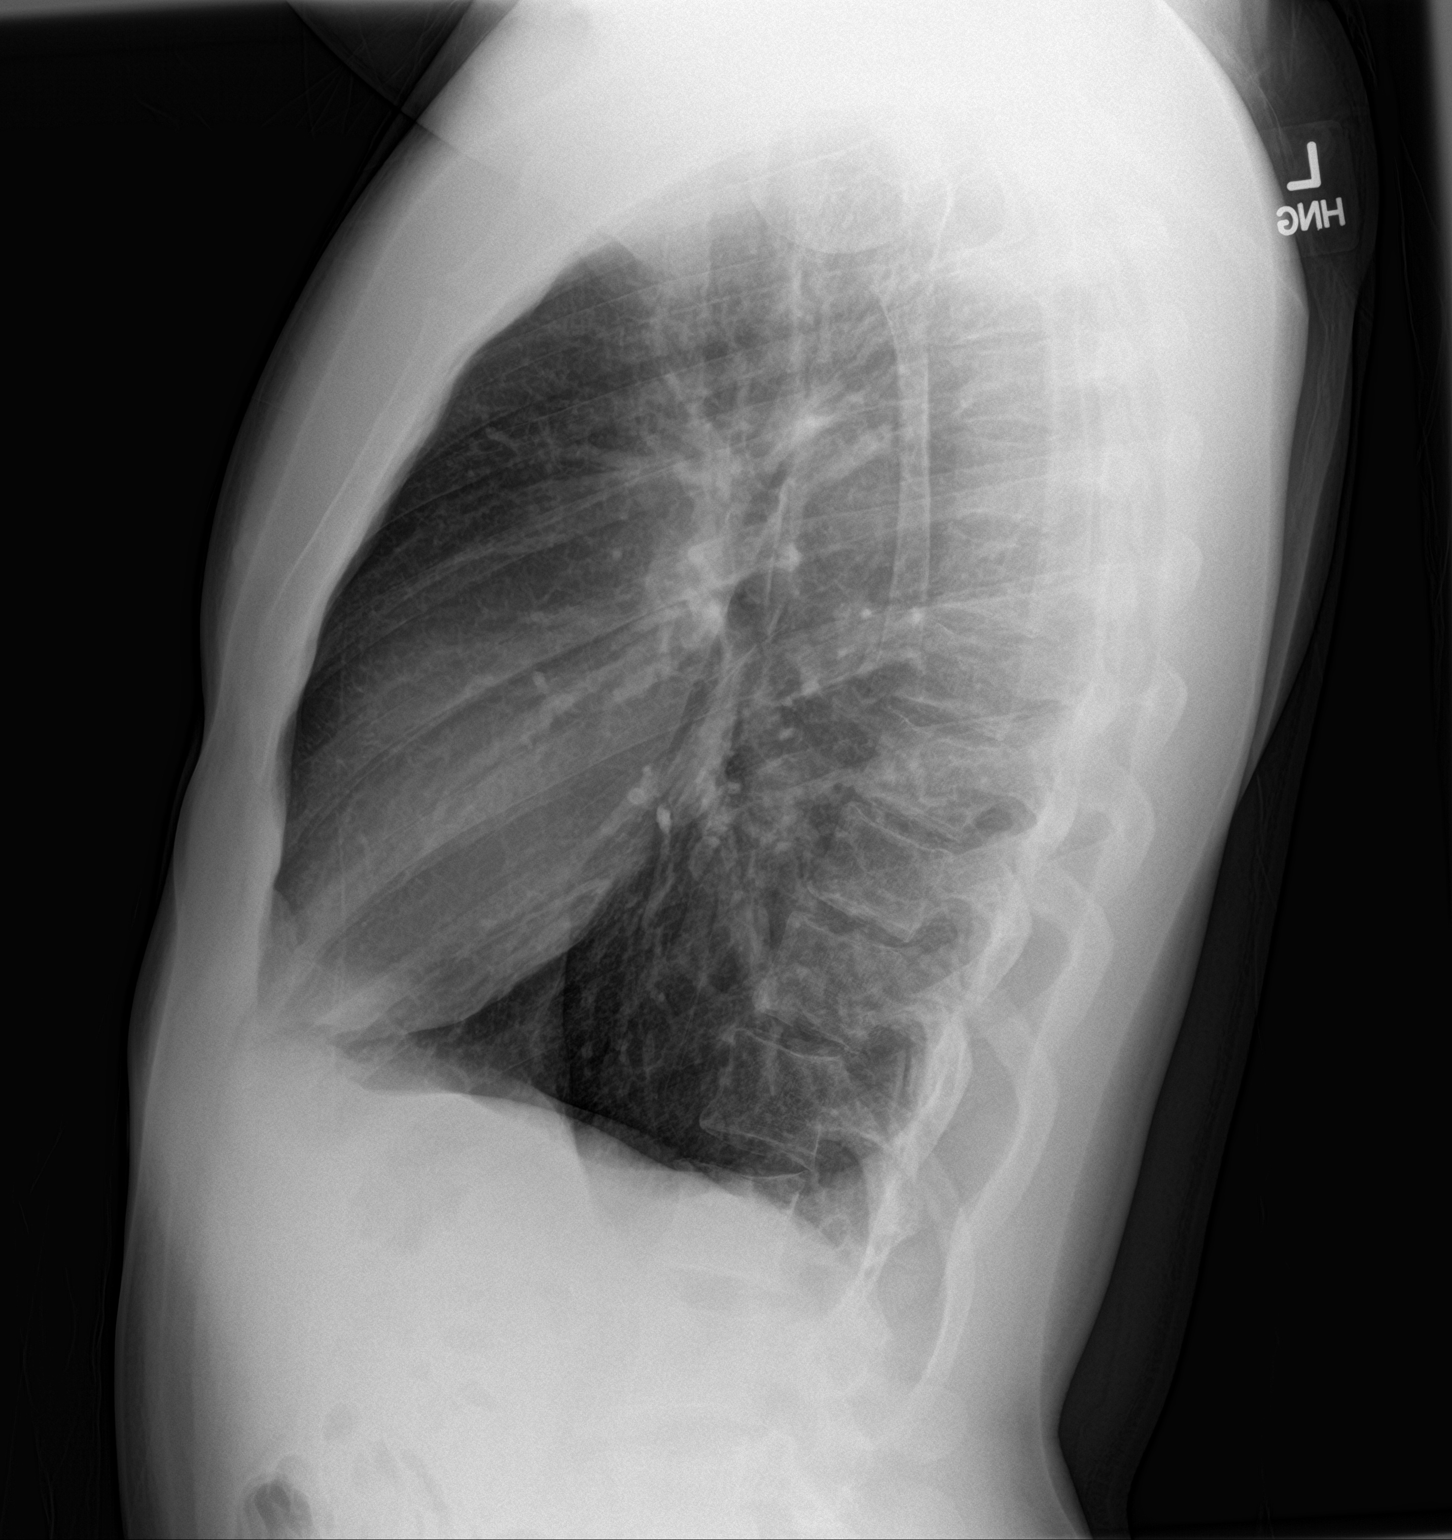

[2 of 2 positions shown; findings below may reference images not displayed]

FINDINGS: Lungs are adequately inflated without focal consolidation, effusion
or pneumothorax. Cardiomediastinal silhouette is within normal.
Bones and soft tissues are within normal.
IMPRESSION: No active cardiopulmonary disease.

## 2019-08-15 IMAGING — CR DG FINGER RING 2+V*R*
3 series · 3 of 3 positions shown · non-contrast
Comparison: None.

CLINICAL DATA: Football injury with fourth digit pain, initial
encounter

EXAM:
RIGHT RING FINGER 2+V

[finger ap]
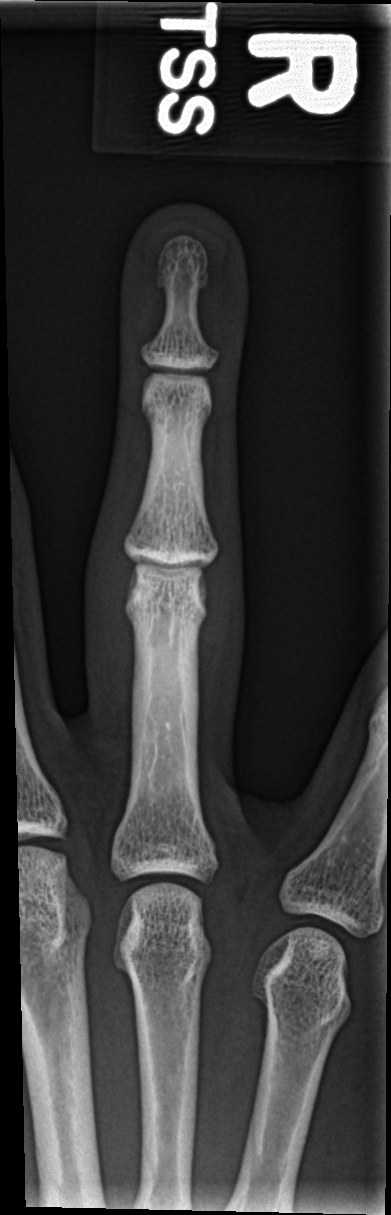

[finger obl]
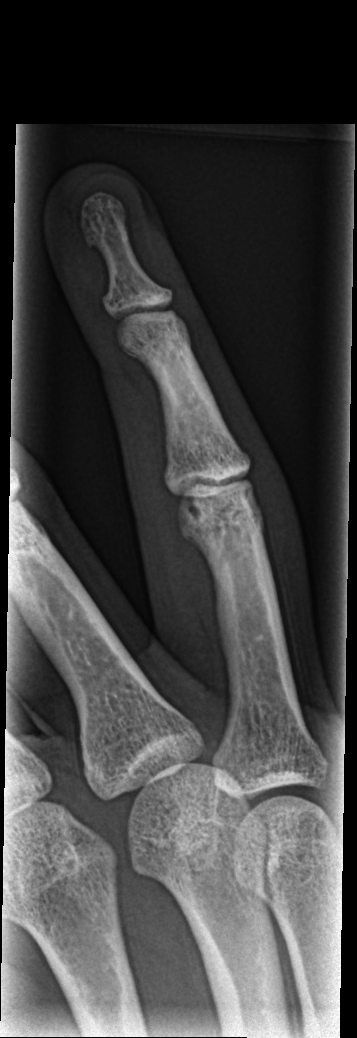

[finger lat]
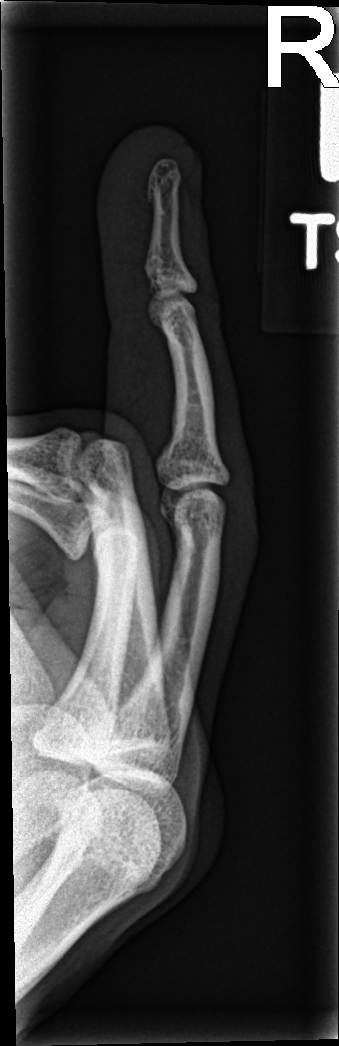

[3 of 3 positions shown; findings below may reference images not displayed]

FINDINGS: Soft tissue swelling is noted. No definitive fracture or dislocation
is seen.
IMPRESSION: Soft tissue swelling without acute bony abnormality.

## 2020-09-07 ENCOUNTER — Encounter: Payer: Self-pay | Admitting: Family Medicine

## 2020-09-07 ENCOUNTER — Ambulatory Visit: Payer: Self-pay | Admitting: Family Medicine

## 2020-09-07 ENCOUNTER — Other Ambulatory Visit: Payer: Self-pay

## 2020-09-07 DIAGNOSIS — S3991XA Unspecified injury of abdomen, initial encounter: Secondary | ICD-10-CM

## 2020-09-07 DIAGNOSIS — Z113 Encounter for screening for infections with a predominantly sexual mode of transmission: Secondary | ICD-10-CM

## 2020-09-07 LAB — GRAM STAIN

## 2020-09-07 NOTE — Progress Notes (Signed)
Gram stain reviewed with provider and is negative today, so no treatment needed for gram stain per standing order and per provider verbal order. Provider orders completed. 

## 2020-09-07 NOTE — Progress Notes (Signed)
[facility] Department STI clinic/screening visit  Subjective:  [place] P Windle is a 29 y.o. male being seen today for an STI screening visit. The patient reports they do not have symptoms.    Patient has the following medical conditions:  There are no problems to display for this patient.    No chief complaint on file.   HPI  Patient reports he is here for evaluation of L groin x x q about 2 weeks.  States the area has a burning/irritated sensation, intermittent and is more noticeable when walking/moving around.  States that in 2020 he was treated for chlamydia in [place] -he denies similar symptoms.  Hasn't been sexually active for several months until 08/30/2020. He states that he used a condom. 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  He admits that he did "heavy lifting" the day before the pain began.  He wonders if this caused the symptoms.   See flowsheet for further details and programmatic requirements.    The following portions of the patient's history were reviewed and updated as appropriate: allergies, current medications, past medical history, past social history, past surgical history and problem list.  Objective:  There were no vitals filed for this visit.  Physical Exam Constitutional:      Appearance: Normal appearance.  HENT:     Head: Normocephalic and atraumatic.     Comments: No nits or hair loss    Mouth/Throat:     Mouth: Mucous membranes are moist.     Pharynx: Oropharynx is clear. No oropharyngeal exudate or posterior oropharyngeal erythema.  Pulmonary:     Effort: Pulmonary effort is normal.  Abdominal:     General: Abdomen is flat.     Palpations: Abdomen is soft. There is no hepatomegaly or mass.     Tenderness: There is no abdominal tenderness.  Genitourinary:    Pubic Area: No rash or pubic lice.      Penis: Normal.      Testes: Normal.     Epididymis:     Right: Normal.     Left: Normal.     Comments:  Bilateral groin area- no hernia, no tenderness on exam, no bulging area. Lymphadenopathy:     Head:     Right side of head: No preauricular or posterior auricular adenopathy.     Left side of head: No preauricular or posterior auricular adenopathy.     Cervical: No cervical adenopathy.     Upper Body:     Right upper body: No supraclavicular or axillary adenopathy.     Left upper body: No supraclavicular or axillary adenopathy.     Lower Body: No right inguinal adenopathy. No left inguinal adenopathy.  Skin:    General: Skin is warm and dry.     Findings: No rash.  Neurological:     Mental Status: He is alert and oriented to person, place, and time.     Assessment and Plan:  [place] P Quast is a 29 y.o. male presenting to the [facility] Department for STI screening  1. Screening examination for venereal disease  - Gram stain - Gonococcus culture - HIV/HCV [place] [facility] - Hepatitis Serology, [place] - Syphilis Serology, [facility]  2. Injury of Left groin, initial episode Encourage client to establish PCP for f/u- states he is in the process of applying for health insurance. Suggested d/c his weights/bench pressing so that he rest his lower body.  Co to use NSAIDS, ice/heat to groin area 3-4 times/day x 1 week.  When restarts weight lifting to start with lower weight amounts.   No follow-ups on file.  No future appointments.  Larene Pickett, FNP

## 2020-09-24 LAB — GONOCOCCUS CULTURE

## 2020-09-26 ENCOUNTER — Telehealth: Payer: Self-pay

## 2020-09-26 NOTE — Telephone Encounter (Signed)
TC with patient.  Informed GC cx cancelled via [facility].  Discussed blood work results with patient and negative gram stain.  Patient has no concerns at this time.Richmond Campbell, RN

## 2023-10-16 ENCOUNTER — Encounter: Payer: Self-pay | Admitting: Emergency Medicine

## 2023-10-16 ENCOUNTER — Other Ambulatory Visit: Payer: Self-pay

## 2023-10-16 ENCOUNTER — Ambulatory Visit
Admission: EM | Admit: 2023-10-16 | Discharge: 2023-10-16 | Payer: Self-pay | Attending: Family Medicine | Admitting: Family Medicine

## 2023-10-16 ENCOUNTER — Emergency Department: Admit: 2023-10-16 | Payer: Self-pay

## 2023-10-16 DIAGNOSIS — R202 Paresthesia of skin: Secondary | ICD-10-CM | POA: Insufficient documentation

## 2023-10-16 DIAGNOSIS — R1032 Left lower quadrant pain: Secondary | ICD-10-CM

## 2023-10-16 LAB — BASIC METABOLIC PANEL
Anion gap: 10 (ref 5–15)
BUN: 13 mg/dL (ref 6–20)
CO2: 26 mmol/L (ref 22–32)
Calcium: 9.9 mg/dL (ref 8.9–10.3)
Chloride: 97 mmol/L — ABNORMAL LOW (ref 98–111)
Creatinine, Ser: 1.23 mg/dL (ref 0.61–1.24)
GFR, Estimated: >60 mL/min (ref >60)
Glucose, Bld: 84 mg/dL (ref 70–99)
Potassium: 4.7 mmol/L (ref 3.5–5.1)
Sodium: 133 mmol/L — ABNORMAL LOW (ref 135–145)

## 2023-10-16 LAB — CBC WITH DIFFERENTIAL/PLATELET
Abs Immature Granulocytes: 0 10*3/uL (ref 0.00–0.07)
Basophils Absolute: 0 10*3/uL (ref 0.0–0.1)
Basophils Relative: 1 %
Eosinophils Absolute: 0 10*3/uL (ref 0.0–0.5)
Eosinophils Relative: 1 %
HCT: 48.3 % (ref 39.0–52.0)
Hemoglobin: 15.4 g/dL (ref 13.0–17.0)
Immature Granulocytes: 0 %
Lymphocytes Relative: 29 %
Lymphs Abs: 1 10*3/uL (ref 0.7–4.0)
MCH: 23.6 pg — ABNORMAL LOW (ref 26.0–34.0)
MCHC: 31.9 g/dL (ref 30.0–36.0)
MCV: 74 fL — ABNORMAL LOW (ref 80.0–100.0)
Monocytes Absolute: 0.3 10*3/uL (ref 0.1–1.0)
Monocytes Relative: 8 %
Neutro Abs: 2.1 10*3/uL (ref 1.7–7.7)
Neutrophils Relative %: 61 %
Platelets: 278 10*3/uL (ref 150–400)
RBC: 6.53 MIL/uL — ABNORMAL HIGH (ref 4.22–5.81)
RDW: 13.8 % (ref 11.5–15.5)
WBC: 3.4 10*3/uL — ABNORMAL LOW (ref 4.0–10.5)
nRBC: 0 % (ref 0.0–0.2)

## 2023-10-16 LAB — URINALYSIS, W/ REFLEX TO CULTURE (INFECTION SUSPECTED)
Glucose, UA: NEGATIVE mg/dL
Hgb urine dipstick: NEGATIVE
Ketones, ur: 40 mg/dL — AB
Leukocytes,Ua: NEGATIVE
Nitrite: NEGATIVE
RBC / HPF: NONE SEEN RBC/hpf (ref 0–5)
Specific Gravity, Urine: 1.025 (ref 1.005–1.030)
pH: 6 (ref 5.0–8.0)

## 2023-10-16 LAB — VITAMIN B12: Vitamin B-12: 523 pg/mL (ref 180–914)

## 2023-10-16 NOTE — ED Notes (Signed)
Spoke to Dr Rachael Darby about this nurses interaction with patient

## 2023-10-16 NOTE — ED Notes (Signed)
[facility]- send patient over now to the hospital

## 2023-10-16 NOTE — ED Provider Notes (Signed)
[facility]    CSN: [id number] Arrival date & time: 10/16/23  1005      History   Chief Complaint No chief complaint on file.   HPI  HPI  Erik Kent is a 32 y.o. male for left groin pain that is progressively getting worse for the past couple months. He has been getting worse daily in the past week. Has been having dull pain in his groin.  Has been tested for STD and there were negative.  Denies testicular pain.   Has facial tightness across his T zone and under his eyes but can at times feel it in his jaw.  No sharp pain in his arms.   Last year, he last his maternal cousin to prostate and colon cancer and he was 34 year old.  He recently turned 32 years old. No first degree relative with colon cancer, breast cancer or colon cancer. His grandmother died at 66.  States most people live a long. He tries to explain his symptoms to others but they just don't understand. He has "run out of space in the unknown."   Frequents the [employer] with Dr Mayford Knife in [place].      Past Medical History:  Diagnosis Date   Rhabdomyolysis     There are no active problems to display for this patient.   History reviewed. No pertinent surgical history.     Home Medications    Prior to Admission medications   Medication Sig Start Date End Date Taking? Authorizing Provider  Multiple Vitamin (MULTIVITAMIN) tablet Take 1 tablet by mouth daily.   Yes [provider]    Family History History reviewed. No pertinent family history.  Social History Social History   Tobacco Use   Smoking status: Former    Types: Cigarettes, Cigars   Smokeless tobacco: Current   Tobacco comments:    occas. vapes  Vaping Use   Vaping status: Former  Substance Use Topics   Alcohol use: Yes    Comment: occasionally   Drug use: Yes    Types: Marijuana     Allergies   Patient has no known allergies.   Review of Systems Review of Systems: negative unless otherwise  stated in HPI.      Physical Exam Triage Vital Signs ED Triage Vitals  Encounter Vitals Group     BP 10/16/23 1023 119/80     Systolic BP Percentile --      Diastolic BP Percentile --      Pulse Rate 10/16/23 1023 86     Resp 10/16/23 1023 18     Temp 10/16/23 1023 98.7 F (37.1 C)     Temp Source 10/16/23 1023 Oral     SpO2 10/16/23 1023 96 %     Weight --      Height --      Head Circumference --      Peak Flow --      Pain Score 10/16/23 1018 7     Pain Loc --      Pain Education --      Exclude from Growth Chart --    No data found.  Updated Vital Signs BP 119/80 (BP Location: Left Arm)   Pulse 86   Temp 98.7 F (37.1 C) (Oral)   Resp 18   SpO2 96%   Visual Acuity Right Eye Distance:   Left Eye Distance:   Bilateral Distance:    Right Eye Near:   Left Eye Near:  Bilateral Near:     Physical Exam GEN:  non-ill appearing male in no acute distress  CVS: well perfused, regular rate and rhythm RESP: speaking in full sentences without pause, no respiratory distress, clear bilaterally   GU: genital exam deferred   UC Treatments / Results  Labs (all labs ordered are listed, but only abnormal results are displayed) Labs Reviewed  BASIC METABOLIC PANEL - Abnormal; Notable for the following components:      Result Value   Sodium 133 (*)    Chloride 97 (*)    All other components within normal limits  CBC WITH DIFFERENTIAL/PLATELET - Abnormal; Notable for the following components:   WBC 3.4 (*)    RBC 6.53 (*)    MCV 74.0 (*)    MCH 23.6 (*)    All other components within normal limits  URINALYSIS, W/ REFLEX TO CULTURE (INFECTION SUSPECTED) - Abnormal; Notable for the following components:   Color, Urine AMBER (*)    Bilirubin Urine SMALL (*)    Ketones, ur 40 (*)    Protein, ur TRACE (*)    Bacteria, UA RARE (*)    All other components within normal limits  VITAMIN B12    EKG   Radiology No results found.  Procedures Procedures (including  critical care time)  Medications Ordered in UC Medications - No data to display  Initial Impression / Assessment and Plan / UC Course  I have reviewed the triage vital signs and the nursing notes.  Pertinent labs & imaging results that were available during my care of the patient were reviewed by me and considered in my medical decision making (see chart for details).     Pt is a 32 yo male with left sided groin pain that started past few months.  Has history of groin pain. Had right groin pain back in Jan 2023 for small right inguinal hernia. He was referred to general surgery but general surgery didn't recommend hernia repair.   Pt is a Systems analyst and after speaking with him he has overcome some obstacles in his life. He is a man of faith. He is very in-tune to things that change with his body and doesn't want to become a "hypochondriac."  Urinalysis didn't show evidence of UTI but did have ketonuria and trace proteinuria. No hematuria but does have small bilirubinuria. STAT scrotal doppler ordered. Pt will travel to [facility] medical mall for ultrasound.   He has been experiencing facial paraesthesias. Obtain BMP, CBC and B12 level.  CBC showing leukopenia which is new for him.  He has microcytosis without anemia. No platelet derailments. Recommended a multivitamin with iron. If leukopenia persists, he may need to see a hematologist. BMP showing hypochloremia hyponatremia.  Advised to eat salty foods for a few days. Vit B12 is normal. Etiology of his symptoms uncertain.   It is important that he establish care with a new PCP.   Final Clinical Impressions(s) / UC Diagnoses   Final diagnoses:  Left groin pain  Facial paresthesia     Discharge Instructions      Go to the medical mall to have an ultrasound performed. Look for signs at [facility] for the [facility]. Enter through the medical mall and go to the radiology department for your ultrasound.   Be  sure to stay hydrated and eat some salty foods. If your white blood cell count continues to be low, speak with your new primary care doctor about seeing a hematologist.  ED Prescriptions   None    PDMP not reviewed this encounter.   Katha Cabal, DO 10/16/23 2045

## 2023-10-16 NOTE — ED Notes (Signed)
Patient hesitates when asked questions about harming himself, but statements related to harming himself center around ongoing health issues, questions and no answers .

## 2023-10-16 NOTE — ED Triage Notes (Signed)
Says he lost his cousin last year to prostate and colon cancer-he was 32 years old

## 2023-10-16 NOTE — ED Triage Notes (Signed)
Left groin pain, sharp.  Pain started a couple of months ago.   Also has pain on face, touching under eyes.  "I can't really explain it, I just know what I feel"

## 2023-10-16 NOTE — Discharge Instructions (Addendum)
Go to the medical mall to have an ultrasound performed. Look for signs at [facility] for the [facility]. Enter through the medical mall and go to the radiology department for your ultrasound.   Be sure to stay hydrated and eat some salty foods. If your white blood cell count continues to be low, speak with your new primary care doctor about seeing a hematologist.

## 2024-10-17 ENCOUNTER — Encounter: Payer: Self-pay | Admitting: Family Medicine
